# Patient Record
Sex: Female | Born: 1990 | Race: Black or African American | Hispanic: No | Marital: Single | Smoking: Former smoker
Health system: Southern US, Community
[De-identification: ages and names within clinical notes are randomized; demographics above are authoritative.]

## PROBLEM LIST (undated history)

## (undated) DIAGNOSIS — F32A Depression, unspecified: Secondary | ICD-10-CM

## (undated) DIAGNOSIS — F329 Major depressive disorder, single episode, unspecified: Secondary | ICD-10-CM

## (undated) DIAGNOSIS — K219 Gastro-esophageal reflux disease without esophagitis: Secondary | ICD-10-CM

## (undated) DIAGNOSIS — R42 Dizziness and giddiness: Secondary | ICD-10-CM

## (undated) DIAGNOSIS — F419 Anxiety disorder, unspecified: Secondary | ICD-10-CM

## (undated) HISTORY — PX: OTHER SURGICAL HISTORY: SHX169

## (undated) HISTORY — PX: NO PAST SURGERIES: SHX2092

## (undated) HISTORY — PX: MOUTH SURGERY: SHX715

---
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
## 2015-12-12 ENCOUNTER — Inpatient Hospital Stay (HOSPITAL_COMMUNITY)
Admission: AD | Admit: 2015-12-12 | Discharge: 2015-12-12 | Disposition: A | Discharge status: Home / Self Care | Payer: Self-pay | Source: Ambulatory Visit | Attending: Obstetrics and Gynecology | Admitting: Obstetrics and Gynecology

## 2015-12-12 ENCOUNTER — Encounter (HOSPITAL_COMMUNITY): Payer: Self-pay | Admitting: *Deleted

## 2015-12-12 DIAGNOSIS — B9689 Other specified bacterial agents as the cause of diseases classified elsewhere: Secondary | ICD-10-CM

## 2015-12-12 DIAGNOSIS — D62 Acute posthemorrhagic anemia: Secondary | ICD-10-CM

## 2015-12-12 DIAGNOSIS — N938 Other specified abnormal uterine and vaginal bleeding: Secondary | ICD-10-CM | POA: Insufficient documentation

## 2015-12-12 DIAGNOSIS — N760 Acute vaginitis: Secondary | ICD-10-CM | POA: Insufficient documentation

## 2015-12-12 DIAGNOSIS — N939 Abnormal uterine and vaginal bleeding, unspecified: Secondary | ICD-10-CM

## 2015-12-12 HISTORY — DX: Dizziness and giddiness: R42

## 2015-12-12 LAB — WET PREP, GENITAL
Sperm: NONE SEEN
Trich, Wet Prep: NONE SEEN
WBC, Wet Prep HPF POC: NONE SEEN
Yeast Wet Prep HPF POC: NONE SEEN

## 2015-12-12 LAB — URINALYSIS, ROUTINE W REFLEX MICROSCOPIC
Bilirubin Urine: NEGATIVE
GLUCOSE, UA: NEGATIVE mg/dL
Hgb urine dipstick: NEGATIVE
Ketones, ur: NEGATIVE mg/dL
LEUKOCYTES UA: NEGATIVE
NITRITE: NEGATIVE
PROTEIN: 30 mg/dL — AB
Specific Gravity, Urine: 1.015 (ref 1.005–1.030)
pH: 7.5 (ref 5.0–8.0)

## 2015-12-12 LAB — URINE MICROSCOPIC-ADD ON

## 2015-12-12 LAB — CBC
HCT: 30.4 % — ABNORMAL LOW (ref 36.0–46.0)
HEMOGLOBIN: 9.6 g/dL — AB (ref 12.0–15.0)
MCH: 24.9 pg — AB (ref 26.0–34.0)
MCHC: 31.6 g/dL (ref 30.0–36.0)
MCV: 78.8 fL (ref 78.0–100.0)
Platelets: 406 10*3/uL — ABNORMAL HIGH (ref 150–400)
RBC: 3.86 MIL/uL — AB (ref 3.87–5.11)
RDW: 13.3 % (ref 11.5–15.5)
WBC: 4.3 10*3/uL (ref 4.0–10.5)

## 2015-12-12 LAB — POCT PREGNANCY, URINE: Preg Test, Ur: NEGATIVE

## 2015-12-12 MED ORDER — METRONIDAZOLE 500 MG PO TABS: 500.0000 mg | ORAL_TABLET | Freq: Two times a day (BID) | ORAL | Status: AC

## 2015-12-12 MED ORDER — NORGESTIMATE-ETH ESTRADIOL 0.25-35 MG-MCG PO TABS: 1.0000 | ORAL_TABLET | Freq: Every day | ORAL | Status: AC

## 2015-12-12 MED ORDER — FERROUS SULFATE 325 (65 FE) MG PO TABS: 325.0000 mg | ORAL_TABLET | Freq: Two times a day (BID) | ORAL | Status: AC

## 2015-12-12 NOTE — Discharge Instructions (Signed)
Abnormal Uterine Bleeding °Abnormal uterine bleeding can affect women at various stages in life, including teenagers, women in their reproductive years, pregnant women, and women who have reached menopause. Several kinds of uterine bleeding are considered abnormal, including: °· Bleeding or spotting between periods.   °· Bleeding after sexual intercourse.   °· Bleeding that is heavier or more than normal.   °· Periods that last longer than usual. °· Bleeding after menopause.   °Many cases of abnormal uterine bleeding are minor and simple to treat, while others are more serious. Any type of abnormal bleeding should be evaluated by your health care provider. Treatment will depend on the cause of the bleeding. °HOME CARE INSTRUCTIONS °Monitor your condition for any changes. The following actions may help to alleviate any discomfort you are experiencing: °· Avoid the use of tampons and douches as directed by your health care provider. °· Change your pads frequently. °You should get regular pelvic exams and Pap tests. Keep all follow-up appointments for diagnostic tests as directed by your health care provider.  °SEEK MEDICAL CARE IF:  °· Your bleeding lasts more than 1 week.   °· You feel dizzy at times.   °SEEK IMMEDIATE MEDICAL CARE IF:  °· You pass out.   °· You are changing pads every 15 to 30 minutes.   °· You have abdominal pain. °· You have a fever.   °· You become sweaty or weak.   °· You are passing large blood clots from the vagina.   °· You start to feel nauseous and vomit. °MAKE SURE YOU:  °· Understand these instructions. °· Will watch your condition. °· Will get help right away if you are not doing well or get worse. °  °This information is not intended to replace advice given to you by your health care provider. Make sure you discuss any questions you have with your health care provider. °  °Document Released: 06/18/2005 Document Revised: 06/23/2013 Document Reviewed: 01/15/2013 °Elsevier Interactive  Patient Education ©2016 [employer] °Bacterial Vaginosis °Bacterial vaginosis is a vaginal infection that occurs when the normal balance of bacteria in the vagina is disrupted. It results from an overgrowth of certain bacteria. This is the most common vaginal infection in women of childbearing age. Treatment is important to prevent complications, especially in pregnant women, as it can cause a premature delivery. °CAUSES  °Bacterial vaginosis is caused by an increase in harmful bacteria that are normally present in smaller amounts in the vagina. Several different kinds of bacteria can cause bacterial vaginosis. However, the reason that the condition develops is not fully understood. °RISK FACTORS °Certain activities or behaviors can put you at an increased risk of developing bacterial vaginosis, including: °· Having a new sex partner or multiple sex partners. °· Douching. °· Using an intrauterine device (IUD) for contraception. °Women do not get bacterial vaginosis from toilet seats, bedding, swimming pools, or contact with objects around them. °SIGNS AND SYMPTOMS  °Some women with bacterial vaginosis have no signs or symptoms. Common symptoms include: °· Grey vaginal discharge. °· A fishlike odor with discharge, especially after sexual intercourse. °· Itching or burning of the vagina and vulva. °· Burning or pain with urination. °DIAGNOSIS  °Your health care provider will take a medical history and examine the vagina for signs of bacterial vaginosis. A sample of vaginal fluid may be taken. Your health care provider will look at this sample under a microscope to check for bacteria and abnormal cells. A vaginal pH test may also be done.  °TREATMENT  °Bacterial vaginosis may be treated   with antibiotic medicines. These may be given in the form of a pill or a vaginal cream. A second round of antibiotics may be prescribed if the condition comes back after treatment. Because bacterial vaginosis increases your risk for  sexually transmitted diseases, getting treated can help reduce your risk for chlamydia, gonorrhea, HIV, and herpes. °HOME CARE INSTRUCTIONS  °· Only take over-the-counter or prescription medicines as directed by your health care provider. °· If antibiotic medicine was prescribed, take it as directed. Make sure you finish it even if you start to feel better. °· Tell all sexual partners that you have a vaginal infection. They should see their health care provider and be treated if they have problems, such as a mild rash or itching. °· During treatment, it is important that you follow these instructions: °¨ Avoid sexual activity or use condoms correctly. °¨ Do not douche. °¨ Avoid alcohol as directed by your health care provider. °¨ Avoid breastfeeding as directed by your health care provider. °SEEK MEDICAL CARE IF:  °· Your symptoms are not improving after 3 days of treatment. °· You have increased discharge or pain. °· You have a fever. °MAKE SURE YOU:  °· Understand these instructions. °· Will watch your condition. °· Will get help right away if you are not doing well or get worse. °FOR MORE INFORMATION  °Centers for Disease Control and Prevention, Division of STD Prevention: www.cdc.gov/std °American Sexual Health Association (ASHA): www.ashastd.org  °  °This information is not intended to replace advice given to you by your health care provider. Make sure you discuss any questions you have with your health care provider. °  °Document Released: 06/18/2005 Document Revised: 07/09/2014 Document Reviewed: 01/28/2013 °Elsevier Interactive Patient Education ©2016 Elsevier Inc. ° °

## 2015-12-12 NOTE — MAU Note (Addendum)
Has been on cycle for 3 months.  At first thought it was stress related, but it has continued.  Has not had sex since Dec. No real pain or dizziness

## 2015-12-12 NOTE — MAU Provider Note (Signed)
History     CSN: [id number]  Arrival date and time: 12/12/15 1022   First Provider Initiated Contact with Patient 12/12/15 1115      Chief Complaint  Patient presents with  . Vaginal Bleeding   HPI   [name] is a 25 y.o. female G0P0000 with a history of irregular cycles,  here with abnormal vaginal bleeding.  Her last menstrual cycle was 4/11; she has been bleeding everyday since the start of her period. The bleeding waxes and wanes from heavy to light. She denies dizziness.  She does not have GYN here; recently moved.   OB History    Gravida Para Term Preterm AB TAB SAB Ectopic Multiple Living        Past Medical History  Diagnosis Date  . Vertigo     Past Surgical History  Procedure Laterality Date  . No past surgeries      No family history on file.  Social History  Substance Use Topics  . Smoking status: Never Smoker   . Smokeless tobacco: None  . Alcohol Use: Yes    Allergies:  Allergies  Allergen Reactions  . Shellfish Allergy Anaphylaxis    Prescriptions prior to admission  Medication Sig Dispense Refill Last Dose  . acetaminophen (TYLENOL) 500 MG tablet Take 500 mg by mouth every 6 (six) hours as needed for mild pain.   Past Month at Unknown time   Results for orders placed or performed during the hospital encounter of 12/12/15 (from the past 48 hour(s))  Urinalysis, Routine w reflex microscopic (not at [facility])     Status: Abnormal   Collection Time: 12/12/15 10:36 AM  Result Value Ref Range   Color, Urine YELLOW YELLOW   APPearance CLEAR CLEAR   Specific Gravity, Urine 1.015 1.005 - 1.030   pH 7.5 5.0 - 8.0   Glucose, UA NEGATIVE NEGATIVE mg/dL   Hgb urine dipstick NEGATIVE NEGATIVE   Bilirubin Urine NEGATIVE NEGATIVE   Ketones, ur NEGATIVE NEGATIVE mg/dL   Protein, ur 30 (A) NEGATIVE mg/dL   Nitrite NEGATIVE NEGATIVE   Leukocytes, UA NEGATIVE NEGATIVE  Urine microscopic-add on     Status: Abnormal   Collection  Time: 12/12/15 10:36 AM  Result Value Ref Range   Squamous Epithelial / LPF 0-5 (A) NONE SEEN   WBC, UA 0-5 0 - 5 WBC/hpf   RBC / HPF 0-5 0 - 5 RBC/hpf   Bacteria, UA RARE (A) NONE SEEN   Urine-Other MUCOUS PRESENT   Pregnancy, urine POC     Status: None   Collection Time: 12/12/15 10:45 AM  Result Value Ref Range   Preg Test, Ur NEGATIVE NEGATIVE    Comment:        THE SENSITIVITY OF THIS METHODOLOGY IS >24 mIU/mL   CBC     Status: Abnormal   Collection Time: 12/12/15 11:14 AM  Result Value Ref Range   WBC 4.3 4.0 - 10.5 K/uL   RBC 3.86 (L) 3.87 - 5.11 MIL/uL   Hemoglobin 9.6 (L) 12.0 - 15.0 g/dL   HCT 78.2 (L) 95.6 - 21.3 %   MCV 78.8 78.0 - 100.0 fL   MCH 24.9 (L) 26.0 - 34.0 pg   MCHC 31.6 30.0 - 36.0 g/dL   RDW 08.6 57.8 - 46.9 %   Platelets 406 (H) 150 - 400 K/uL  Wet prep, genital     Status: Abnormal   Collection Time: 12/12/15 11:25 AM  Result Value Ref Range   Yeast Wet Prep HPF POC NONE SEEN NONE SEEN   Trich, Wet Prep NONE SEEN NONE SEEN   Clue Cells Wet Prep HPF POC PRESENT (A) NONE SEEN   WBC, Wet Prep HPF POC NONE SEEN NONE SEEN    Comment: MANY BACTERIA SEEN   Sperm NONE SEEN     Review of Systems  Constitutional: Positive for chills. Negative for fever and malaise/fatigue.  Gastrointestinal: Negative for abdominal pain.  Neurological: Negative for dizziness and weakness.   Physical Exam   Blood pressure 112/74, pulse 98, temperature 98.3 F (36.8 C), temperature source Oral, resp. rate 18, weight 171 lb 12.8 oz (77.928 kg), last menstrual period 10/11/2015.  Physical Exam  Constitutional: She is oriented to person, place, and time. She appears well-developed and well-nourished. No distress.  GI: Soft. She exhibits no distension. There is no tenderness. There is no rebound and no guarding.  Genitourinary:  Speculum exam: Vagina - Small amount of dark red blood in the vagina, no odor Cervix - + active bleeding from the os.  Bimanual exam: Cervix  closed Uterus non tender, normal size Adnexa non tender, no masses bilaterally GC/Chlam, wet prep done Chaperone present for exam.  Musculoskeletal: Normal range of motion.  Neurological: She is alert and oriented to person, place, and time.  Skin: Skin is warm. She is not diaphoretic.  Psychiatric: Her behavior is normal.    MAU Course  Procedures  None  MDM  Denies history of DVT or PE. She is a Non-smoker.   Assessment and Plan   A:  1. Abnormal vaginal bleeding   2. Acute blood loss anemia   3. Bacterial vaginosis     P:  Discharge home in stable condition Rx: Sprintec- double pills for the first 5 days then one pill per day, iron BID, Flagyl  Return to MAU if symptoms worsen  Bleeding precautions Patient given [facility] contact number if she would like to call to schedule follow up visit.    Duane LopeJennifer I Salahuddin Arismendez, NP 12/12/2015 11:19 AM

## 2015-12-13 LAB — GC/CHLAMYDIA PROBE AMP (~~LOC~~) NOT AT ARMC
CHLAMYDIA, DNA PROBE: NEGATIVE
Neisseria Gonorrhea: NEGATIVE

## 2016-06-21 ENCOUNTER — Emergency Department (HOSPITAL_COMMUNITY)
Admission: EM | Admit: 2016-06-21 | Discharge: 2016-06-21 | Disposition: A | Discharge status: Home / Self Care | Payer: Self-pay | Attending: Emergency Medicine | Admitting: Emergency Medicine

## 2016-06-21 ENCOUNTER — Encounter (HOSPITAL_COMMUNITY): Payer: Self-pay | Admitting: Nurse Practitioner

## 2016-06-21 DIAGNOSIS — K029 Dental caries, unspecified: Secondary | ICD-10-CM

## 2016-06-21 DIAGNOSIS — J029 Acute pharyngitis, unspecified: Secondary | ICD-10-CM

## 2016-06-21 DIAGNOSIS — K0889 Other specified disorders of teeth and supporting structures: Secondary | ICD-10-CM

## 2016-06-21 DIAGNOSIS — F17200 Nicotine dependence, unspecified, uncomplicated: Secondary | ICD-10-CM | POA: Insufficient documentation

## 2016-06-21 LAB — RAPID STREP SCREEN (MED CTR MEBANE ONLY): Streptococcus, Group A Screen (Direct): NEGATIVE

## 2016-06-21 MED ORDER — PENICILLIN V POTASSIUM 500 MG PO TABS: 500.0000 mg | ORAL_TABLET | Freq: Four times a day (QID) | ORAL | 0 refills | Status: AC

## 2016-06-21 MED ORDER — NAPROXEN 500 MG PO TABS: 500.0000 mg | ORAL_TABLET | Freq: Two times a day (BID) | ORAL | 0 refills | Status: AC

## 2016-06-21 MED ORDER — ACETAMINOPHEN 500 MG PO TABS
1000.0000 mg | ORAL_TABLET | Freq: Once | ORAL | Status: AC
  Administered 2016-06-21: 1000 mg via ORAL
  Filled 2016-06-21: qty 2

## 2016-06-21 NOTE — ED Triage Notes (Signed)
Pt presents with c/o mouth pain. The pain has been intermittent over the past several weeks. The pain became constant and severe today. She reports facial swelling earlier today that decreased now

## 2016-06-21 NOTE — ED Provider Notes (Signed)
[facility]-EMERGENCY DEPT Provider Note   CSN: [id number] Arrival date & time: 06/21/16  1907  By signing my name below, I, Carolyn Vasquez, attest that this documentation has been prepared under the direction and in the presence of Cheri FowlerKayla Siler Mavis, PA-C Electronically Signed: Soijett Vasquez, ED Scribe. 06/21/16. 7:41 PM.  History   Chief Complaint Chief Complaint  Patient presents with  . Dental Pain    HPI Carolyn Vasquez is a 25 y.o. female who presents to the Emergency Department complaining of intermittent, generalized, dental pain (R>L) onset 2 weeks ago worsening today. Pt notes that she has a past hx of a dental abscess that she has had drained. She is having associated symptoms of sore throat. She has tried 400 mg OTC ibuprofen with no relief for her symptoms. She denies fever, chills, drainage, drooling, neck pain, and any other symptoms. Pt denies allergies to medications.   The history is provided by the patient. No language interpreter was used.    Past Medical History:  Diagnosis Date  . Vertigo     There are no active problems to display for this patient.   Past Surgical History:  Procedure Laterality Date  . NO PAST SURGERIES      OB History    Gravida Para Term Preterm AB Living   0 0 0 0 0 0   SAB TAB Ectopic Multiple Live Births   0 0 0 0         Home Medications    Prior to Admission medications   Medication Sig Start Date End Date Taking? Authorizing Provider  acetaminophen (TYLENOL) 500 MG tablet Take 500 mg by mouth every 6 (six) hours as needed for mild pain.    Historical Provider, MD  ferrous sulfate 325 (65 FE) MG tablet Take 1 tablet (325 mg total) by mouth 2 (two) times daily with a meal. 12/12/15   Harolyn RutherfordJennifer I Rasch, NP  metroNIDAZOLE (FLAGYL) 500 MG tablet Take 1 tablet (500 mg total) by mouth 2 (two) times daily. 12/12/15   Duane LopeJennifer I Rasch, NP  naproxen (NAPROSYN) 500 MG tablet Take 1 tablet (500 mg total) by mouth 2 (two) times daily. 06/21/16    Cheri FowlerKayla Lanae Federer, PA-C  norgestimate-ethinyl estradiol (ORTHO-CYCLEN,SPRINTEC,PREVIFEM) 0.25-35 MG-MCG tablet Take 1 tablet by mouth daily. 12/12/15   Duane LopeJennifer I Rasch, NP  penicillin v potassium (VEETID) 500 MG tablet Take 1 tablet (500 mg total) by mouth 4 (four) times daily. 06/21/16 06/28/16  Cheri FowlerKayla Coden Franchi, PA-C    Family History History reviewed. No pertinent family history.  Social History Social History  Substance Use Topics  . Smoking status: Current Every Day Smoker  . Smokeless tobacco: Not on file  . Alcohol use Yes     Allergies   Shellfish allergy   Review of Systems Review of Systems  Constitutional: Negative for chills and fever.  HENT: Positive for dental problem (generalized) and sore throat. Negative for drooling.        No drainage to the affected area    Physical Exam Updated Vital Signs Pulse 106   Temp 98.3 F (36.8 C) (Oral)   Resp 16   SpO2 100%   Physical Exam  Constitutional: She is oriented to person, place, and time. She appears well-developed and well-nourished.  HENT:  Head: Normocephalic and atraumatic.  Mouth/Throat: Uvula is midline and mucous membranes are normal. No oral lesions. No trismus in the jaw. Abnormal dentition. Dental caries present. No dental abscesses or uvula swelling. No posterior oropharyngeal edema, posterior  oropharyngeal erythema or tonsillar abscesses. Tonsils are 2+ on the right. Tonsils are 2+ on the left. Tonsillar exudate.  No tongue swelling or facial swelling.  Airway patent. Patient tolerating secretions without difficulty.   Eyes: Conjunctivae are normal.  Neck: Normal range of motion. Neck supple.  No evidence of Ludwigs angina.  Cardiovascular: Normal rate, regular rhythm and normal heart sounds.   Pulmonary/Chest: Effort normal and breath sounds normal.  Abdominal: Bowel sounds are normal. She exhibits no distension.  Musculoskeletal:  Moves all extremities spontaneously.  Lymphadenopathy:    She has no  cervical adenopathy.  Neurological: She is alert and oriented to person, place, and time.  Speech clear without dysarthria.   Skin: Skin is warm and dry.  Nursing note and vitals reviewed.    ED Treatments / Results  DIAGNOSTIC STUDIES: Oxygen Saturation is 100% on RA, nl by my interpretation.    COORDINATION OF CARE: 7:39 PM Discussed treatment plan with pt at bedside which includes rapid strep screen and culture, referral and follow up with dentist, penicillin Rx, alternate ibuprofen and tylenol PRN, and pt agreed to plan.  Labs Labs Reviewed  RAPID STREP SCREEN (NOT AT [facility])  CULTURE, GROUP A STREP [facility])    Procedures Procedures (including critical care time)  Medications Ordered in ED Medications  acetaminophen (TYLENOL) tablet 1,000 mg (1,000 mg Oral Given 06/21/16 1944)     Initial Impression / Assessment and Plan / ED Course  I have reviewed the triage vital signs and the nursing notes.  Clinical Course    Patient with dentalgia and viral pharyngitis.  No abscess requiring immediate incision and drainage.  Exam not concerning for Ludwig's angina or pharyngeal abscess. Rapid strep screen collected due to oropharyngeal exudates with negative results. Do not suspect PTA or RPA.  Discussed that results of strep culture are pending and patient will be informed if positive result and abx will be called in at that time. Will discharge with penicillin and naproxen Rx. Pt advised of symptomatic therapy. Pt instructed to follow-up with dentist.  Discussed return precautions. Pt safe for discharge.   Final Clinical Impressions(s) / ED Diagnoses   Final diagnoses:  Dental caries  Pain, dental  Pharyngitis, unspecified etiology    New Prescriptions New Prescriptions   NAPROXEN (NAPROSYN) 500 MG TABLET    Take 1 tablet (500 mg total) by mouth 2 (two) times daily.   PENICILLIN V POTASSIUM (VEETID) 500 MG TABLET    Take 1 tablet (500 mg total) by mouth 4 (four) times daily.     I personally performed the services described in this documentation, which was scribed in my presence. The recorded information has been reviewed and is accurate.     Cheri FowlerKayla Rayfield Beem, PA-C 06/21/16 2028    Rolland PorterMark James, MD 06/30/16 [phone]

## 2016-06-21 NOTE — Discharge Instructions (Signed)
Please follow up with a Dentist for further evaluation and management.   Call [phone] for Emergent Dental Care  Check this website for free, low-income or sliding scale dental services in [place]. www.freedental.us   To find a dentist in the [place] or surrounding areas check this website: MyMultiple.fihttp://www.ncdental.org/for-the-public/find-[email or web address]-dentisPlease follow-up with the dentist using the resources provided.   Your rapid strep was negative.  This is likely viral. Take penicillin 4 times daily to treat for possible dental infection. You may take Naproxen twice daily for pain. Return to the emergency department for sudden worsening pain, fever, tongue swelling, neck pain, or any new or concerning symptoms.

## 2016-06-23 LAB — CULTURE, GROUP A STREP (THRC)

## 2017-11-15 ENCOUNTER — Other Ambulatory Visit: Payer: Self-pay | Admitting: Obstetrics and Gynecology

## 2017-11-15 ENCOUNTER — Other Ambulatory Visit (HOSPITAL_COMMUNITY)
Admission: RE | Admit: 2017-11-15 | Discharge: 2017-11-15 | Disposition: A | Discharge status: Home / Self Care | Payer: BLUE CROSS/BLUE SHIELD | Source: Ambulatory Visit | Attending: Obstetrics and Gynecology | Admitting: Obstetrics and Gynecology

## 2017-11-15 DIAGNOSIS — Z01419 Encounter for gynecological examination (general) (routine) without abnormal findings: Secondary | ICD-10-CM | POA: Diagnosis not present

## 2017-11-20 LAB — CYTOLOGY - PAP: Diagnosis: NEGATIVE

## 2018-11-24 ENCOUNTER — Other Ambulatory Visit: Payer: Self-pay

## 2018-11-24 ENCOUNTER — Emergency Department (HOSPITAL_COMMUNITY): Payer: BLUE CROSS/BLUE SHIELD

## 2018-11-24 ENCOUNTER — Emergency Department (HOSPITAL_COMMUNITY)
Admission: EM | Admit: 2018-11-24 | Discharge: 2018-11-25 | Disposition: A | Discharge status: Home / Self Care | Payer: BLUE CROSS/BLUE SHIELD | Attending: Emergency Medicine | Admitting: Emergency Medicine

## 2018-11-24 ENCOUNTER — Encounter (HOSPITAL_COMMUNITY): Payer: Self-pay

## 2018-11-24 DIAGNOSIS — F17200 Nicotine dependence, unspecified, uncomplicated: Secondary | ICD-10-CM | POA: Insufficient documentation

## 2018-11-24 DIAGNOSIS — F1210 Cannabis abuse, uncomplicated: Secondary | ICD-10-CM | POA: Insufficient documentation

## 2018-11-24 DIAGNOSIS — R0789 Other chest pain: Secondary | ICD-10-CM | POA: Diagnosis present

## 2018-11-24 DIAGNOSIS — Z79899 Other long term (current) drug therapy: Secondary | ICD-10-CM | POA: Insufficient documentation

## 2018-11-24 DIAGNOSIS — E876 Hypokalemia: Secondary | ICD-10-CM | POA: Diagnosis not present

## 2018-11-24 DIAGNOSIS — K219 Gastro-esophageal reflux disease without esophagitis: Secondary | ICD-10-CM | POA: Insufficient documentation

## 2018-11-24 DIAGNOSIS — R202 Paresthesia of skin: Secondary | ICD-10-CM | POA: Diagnosis not present

## 2018-11-24 LAB — CBC WITH DIFFERENTIAL/PLATELET
Abs Immature Granulocytes: 0.01 10*3/uL (ref 0.00–0.07)
Basophils Absolute: 0 10*3/uL (ref 0.0–0.1)
Basophils Relative: 1 %
Eosinophils Absolute: 0.3 10*3/uL (ref 0.0–0.5)
Eosinophils Relative: 5 %
HCT: 38.9 % (ref 36.0–46.0)
Hemoglobin: 12.2 g/dL (ref 12.0–15.0)
Immature Granulocytes: 0 %
Lymphocytes Relative: 41 %
Lymphs Abs: 2.4 10*3/uL (ref 0.7–4.0)
MCH: 27.5 pg (ref 26.0–34.0)
MCHC: 31.4 g/dL (ref 30.0–36.0)
MCV: 87.8 fL (ref 80.0–100.0)
Monocytes Absolute: 0.5 10*3/uL (ref 0.1–1.0)
Monocytes Relative: 8 %
Neutro Abs: 2.7 10*3/uL (ref 1.7–7.7)
Neutrophils Relative %: 45 %
Platelets: 303 10*3/uL (ref 150–400)
RBC: 4.43 MIL/uL (ref 3.87–5.11)
RDW: 13.2 % (ref 11.5–15.5)
WBC: 5.8 10*3/uL (ref 4.0–10.5)
nRBC: 0 % (ref 0.0–0.2)

## 2018-11-24 LAB — I-STAT TROPONIN, ED: Troponin i, poc: 0 ng/mL (ref 0.00–0.08)

## 2018-11-24 MED ORDER — DICYCLOMINE HCL 10 MG/5ML PO SOLN
10.0000 mg | Freq: Once | ORAL | Status: AC
Start: 2018-11-24 — End: 2018-11-24
  Administered 2018-11-24: 10 mg via ORAL
  Filled 2018-11-24: qty 5

## 2018-11-24 MED ORDER — ALUM & MAG HYDROXIDE-SIMETH 200-200-20 MG/5ML PO SUSP
30.0000 mL | Freq: Once | ORAL | Status: AC
  Administered 2018-11-24: 30 mL via ORAL
  Filled 2018-11-24: qty 30

## 2018-11-24 NOTE — ED Notes (Signed)
Pt transported to xray 

## 2018-11-24 NOTE — ED Provider Notes (Signed)
[facility]-EMERGENCY DEPT Provider Note   CSN: [id number] Arrival date & time: 11/24/18  2059    History   Chief Complaint Chief Complaint  Patient presents with  . Tingling    HPI Carolyn Vasquez is a 28 y.o. female.     HPI  This is a 28 year old female who presents with chest discomfort.  Patient reports several week history of ongoing chest discomfort.  She states that it is in her center of her chest and is nonradiating.  It sometimes is worse with eating.  She thought it was reflux and took antacids with no relief.  She has not had any fevers, cough, shortness of breath.  She does report tingling in the left arm and left leg.  She states that she is unsure whether this is related.  She also has noted that she has had difficulty with her left hand and she feels that has been weak.  She denies any specific neck or back pain.  Denies headaches.  Currently she rates her pain at 7 out of 10.  She saw her primary physician who thought she had allergies.  She does not tolerate allergy medication because of somnolence.  Past Medical History:  Diagnosis Date  . Vertigo     There are no active problems to display for this patient.   Past Surgical History:  Procedure Laterality Date  . NO PAST SURGERIES       OB History    Gravida  0   Para  0   Term  0   Preterm  0   AB  0   Living  0     SAB  0   TAB  0   Ectopic  0   Multiple  0   Live Births               Home Medications    Prior to Admission medications   Medication Sig Start Date End Date Taking? Authorizing Provider  acetaminophen (TYLENOL) 500 MG tablet Take 500 mg by mouth every 6 (six) hours as needed for mild pain.   Yes [provider]  famotidine (PEPCID) 20 MG tablet Take 20 mg by mouth 2 (two) times daily as needed for heartburn or indigestion.   Yes [provider]  fluticasone (FLONASE) 50 MCG/ACT nasal spray Place 2 sprays into both nostrils  daily as needed for allergies or rhinitis.   Yes [provider]  levocetirizine (XYZAL) 5 MG tablet Take 5 mg by mouth daily as needed for allergies.   Yes [provider]  Multiple Vitamin (MULTIVITAMIN WITH MINERALS) TABS tablet Take 1 tablet by mouth daily.   Yes [provider]  ferrous sulfate 325 (65 FE) MG tablet Take 1 tablet (325 mg total) by mouth 2 (two) times daily with a meal. Patient not taking: Reported on 11/24/2018 12/12/15   Rasch, Victorino Dike I, NP  metroNIDAZOLE (FLAGYL) 500 MG tablet Take 1 tablet (500 mg total) by mouth 2 (two) times daily. Patient not taking: Reported on 11/24/2018 12/12/15   Rasch, Victorino Dike I, NP  naproxen (NAPROSYN) 500 MG tablet Take 1 tablet (500 mg total) by mouth 2 (two) times daily. Patient not taking: Reported on 11/24/2018 06/21/16   Cheri Fowler, PA-C  norgestimate-ethinyl estradiol (ORTHO-CYCLEN,SPRINTEC,PREVIFEM) 0.25-35 MG-MCG tablet Take 1 tablet by mouth daily. Patient not taking: Reported on 11/24/2018 12/12/15   Rasch, Victorino Dike I, NP  omeprazole (PRILOSEC) 20 MG capsule Take 1 capsule (20 mg total) by  mouth daily. 11/25/18   Unika Nazareno, Mayer Masker, MD  potassium chloride SA (K-DUR) 20 MEQ tablet Take 2 tablets (40 mEq total) by mouth daily. 11/25/18   Ngoc Daughtridge, Mayer Masker, MD    Family History History reviewed. No pertinent family history.  Social History Social History   Tobacco Use  . Smoking status: Current Every Day Smoker  Substance Use Topics  . Alcohol use: Yes  . Drug use: Yes    Types: Marijuana     Allergies   Shellfish allergy   Review of Systems Review of Systems  Constitutional: Negative for fever.  Eyes: Negative for visual disturbance.  Respiratory: Negative for shortness of breath.   Cardiovascular: Positive for chest pain. Negative for leg swelling.  Gastrointestinal: Negative for abdominal pain, nausea and vomiting.  Genitourinary: Negative for dysuria.  Skin: Negative for rash.   Neurological: Positive for weakness.       Tingling left arm and left leg  All other systems reviewed and are negative.    Physical Exam Updated Vital Signs BP 112/85 (BP Location: Left Arm)   Pulse 78   Temp 98.7 F (37.1 C) (Oral)   Resp 15   LMP 11/04/2018   SpO2 98%   Physical Exam Vitals signs and nursing note reviewed.  Constitutional:      Appearance: She is well-developed.  HENT:     Head: Normocephalic and atraumatic.  Eyes:     Pupils: Pupils are equal, round, and reactive to light.  Neck:     Musculoskeletal: Neck supple.  Cardiovascular:     Rate and Rhythm: Normal rate and regular rhythm.     Heart sounds: Normal heart sounds.  Pulmonary:     Effort: Pulmonary effort is normal. No respiratory distress.     Breath sounds: No wheezing.  Abdominal:     General: Bowel sounds are normal.     Palpations: Abdomen is soft.  Musculoskeletal:     Right lower leg: No edema.     Left lower leg: No edema.  Skin:    General: Skin is warm and dry.  Neurological:     Mental Status: She is alert and oriented to person, place, and time.     Comments: Cranial nerves II through XII intact, 5 out of 5 strength in all 4 extremities including grip strength, biceps, triceps, deltoids, knee flexors and extensors, symmetric reflexes bilaterally  Psychiatric:        Mood and Affect: Mood normal.      ED Treatments / Results  Labs (all labs ordered are listed, but only abnormal results are displayed) Labs Reviewed  BASIC METABOLIC PANEL - Abnormal; Notable for the following components:      Result Value   Potassium 3.1 (*)    All other components within normal limits  CBC WITH DIFFERENTIAL/PLATELET  I-STAT TROPONIN, ED    EKG EKG Interpretation  Date/Time:  Monday Nov 24 2018 23:39:32 EDT Ventricular Rate:  74 PR Interval:    QRS Duration: 87 QT Interval:  389 QTC Calculation: 432 R Axis:   45 Text Interpretation:  Sinus rhythm Confirmed by Ross Marcus  [phone]) on 11/25/2018 12:26:06 AM   Radiology Dg Chest 2 View  Result Date: 11/24/2018 CLINICAL DATA:  Chest pain EXAM: CHEST - 2 VIEW COMPARISON:  None. FINDINGS: Lungs are clear. Heart size and pulmonary vascularity are normal. No adenopathy. No pneumothorax. No bone lesions. IMPRESSION: No edema or consolidation. Electronically Signed   By: Bretta Bang III M.D.  On: 11/24/2018 23:41    Procedures Procedures (including critical care time)  Medications Ordered in ED Medications  alum & mag hydroxide-simeth (MAALOX/MYLANTA) 200-200-20 MG/5ML suspension 30 mL (30 mLs Oral Given 11/24/18 2344)  dicyclomine (BENTYL) 10 MG/5ML syrup 10 mg (10 mg Oral Given 11/24/18 2344)  potassium chloride SA (K-DUR) CR tablet 40 mEq (40 mEq Oral Given 11/25/18 0044)     Initial Impression / Assessment and Plan / ED Course  I have reviewed the triage vital signs and the nursing notes.  Pertinent labs & imaging results that were available during my care of the patient were reviewed by me and considered in my medical decision making (see chart for details).        Patient presents with several complaints.  Initially with chest discomfort ongoing for the last several weeks as well as tingling and weakness in the left arm and leg.  She is overall nontoxic-appearing and vital signs are reassuring.  Regarding her chest pain, she is low risk for PE.  EKG shows no evidence of arrhythmia or ischemia.  Chest x-ray shows no evidence of pneumothorax or pneumonia.  There is some relation to food.  This would be suggestive of reflux.  Patient did have improvement of pain with GI cocktail.  Lab work shows slightly low potassium.  While this can contribute to paresthesias, slightly concerned that her paresthesias are one-sided.  She also reports weakness.  On my physical exam, I cannot elicit any asymmetric weakness or neurologic findings.  Given her age and waxing and waning symptoms, question possible MS.  She does  not have any vision changes.  I discussed this with the patient.  On recheck, she is currently symptom-free.  I discussed MRI head neck versus watchful waiting and monitoring closely of symptoms.  Patient does not wish to stay for an MRI.  She understands the importance of receiving an MRI especially if she develops progressive or worsening symptoms including vision changes.  After history, exam, and medical workup I feel the patient has been appropriately medically screened and is safe for discharge home. Pertinent diagnoses were discussed with the patient. Patient was given return precautions.   Final Clinical Impressions(s) / ED Diagnoses   Final diagnoses:  Tingling  Gastroesophageal reflux disease without esophagitis  Hypokalemia    ED Discharge Orders         Ordered    potassium chloride SA (K-DUR) 20 MEQ tablet  Daily     11/25/18 0048    omeprazole (PRILOSEC) 20 MG capsule  Daily     11/25/18 0048           Shon Baton, MD 11/25/18 [id number]

## 2018-11-24 NOTE — ED Triage Notes (Signed)
Pt states that her L arm, hand and leg are tingling. She went to her PCP with the same complaint and was told it was allergies. She also endorses intermittent burning in her chest. A&Ox4. Ambulatory.

## 2018-11-25 LAB — BASIC METABOLIC PANEL
Anion gap: 6 (ref 5–15)
BUN: 9 mg/dL (ref 6–20)
CO2: 26 mmol/L (ref 22–32)
Calcium: 8.9 mg/dL (ref 8.9–10.3)
Chloride: 105 mmol/L (ref 98–111)
Creatinine, Ser: 0.68 mg/dL (ref 0.44–1.00)
GFR calc Af Amer: >60 mL/min (ref >60)
GFR calc non Af Amer: >60 mL/min (ref >60)
Glucose, Bld: 85 mg/dL (ref 70–99)
Potassium: 3.1 mmol/L — ABNORMAL LOW (ref 3.5–5.1)
Sodium: 137 mmol/L (ref 135–145)

## 2018-11-25 MED ORDER — POTASSIUM CHLORIDE CRYS ER 20 MEQ PO TBCR: 40.0000 meq | EXTENDED_RELEASE_TABLET | Freq: Every day | ORAL | 0 refills | Status: AC

## 2018-11-25 MED ORDER — POTASSIUM CHLORIDE CRYS ER 20 MEQ PO TBCR
40.0000 meq | EXTENDED_RELEASE_TABLET | Freq: Once | ORAL | Status: AC
  Administered 2018-11-25: 40 meq via ORAL
  Filled 2018-11-25: qty 2

## 2018-11-25 MED ORDER — OMEPRAZOLE 20 MG PO CPDR: 20.0000 mg | DELAYED_RELEASE_CAPSULE | Freq: Every day | ORAL | 0 refills | Status: AC

## 2018-11-25 NOTE — Discharge Instructions (Addendum)
You were seen today for several complaints.  Your chest discomfort is likely related to reflux.  Take medications as prescribed.  Your potassium was slightly low.  Take potassium for the next 5 days and increase potassium in your diet.  Regarding your tingling and weakness, monitor your symptoms closely.  If it returns and persists, gets worse, you develop vision changes, you should be reevaluated immediately and will need an MRI.

## 2018-12-01 ENCOUNTER — Emergency Department (HOSPITAL_COMMUNITY)
Admission: EM | Admit: 2018-12-01 | Discharge: 2018-12-02 | Disposition: A | Discharge status: Home / Self Care | Payer: BLUE CROSS/BLUE SHIELD | Attending: Emergency Medicine | Admitting: Emergency Medicine

## 2018-12-01 ENCOUNTER — Emergency Department (HOSPITAL_COMMUNITY): Payer: BLUE CROSS/BLUE SHIELD

## 2018-12-01 ENCOUNTER — Encounter (HOSPITAL_COMMUNITY): Payer: Self-pay | Admitting: Emergency Medicine

## 2018-12-01 ENCOUNTER — Other Ambulatory Visit: Payer: Self-pay

## 2018-12-01 DIAGNOSIS — F17210 Nicotine dependence, cigarettes, uncomplicated: Secondary | ICD-10-CM | POA: Insufficient documentation

## 2018-12-01 DIAGNOSIS — F1290 Cannabis use, unspecified, uncomplicated: Secondary | ICD-10-CM | POA: Diagnosis not present

## 2018-12-01 DIAGNOSIS — F419 Anxiety disorder, unspecified: Secondary | ICD-10-CM | POA: Diagnosis not present

## 2018-12-01 DIAGNOSIS — Z79899 Other long term (current) drug therapy: Secondary | ICD-10-CM | POA: Diagnosis not present

## 2018-12-01 DIAGNOSIS — R0789 Other chest pain: Secondary | ICD-10-CM | POA: Diagnosis not present

## 2018-12-01 LAB — CBC
HCT: 38.4 % (ref 36.0–46.0)
Hemoglobin: 12.2 g/dL (ref 12.0–15.0)
MCH: 27.1 pg (ref 26.0–34.0)
MCHC: 31.8 g/dL (ref 30.0–36.0)
MCV: 85.3 fL (ref 80.0–100.0)
Platelets: 309 10*3/uL (ref 150–400)
RBC: 4.5 MIL/uL (ref 3.87–5.11)
RDW: 13.2 % (ref 11.5–15.5)
WBC: 4.5 10*3/uL (ref 4.0–10.5)
nRBC: 0 % (ref 0.0–0.2)

## 2018-12-01 LAB — BASIC METABOLIC PANEL
Anion gap: 9 (ref 5–15)
BUN: 9 mg/dL (ref 6–20)
CO2: 23 mmol/L (ref 22–32)
Calcium: 9.3 mg/dL (ref 8.9–10.3)
Chloride: 103 mmol/L (ref 98–111)
Creatinine, Ser: 0.72 mg/dL (ref 0.44–1.00)
GFR calc Af Amer: >60 mL/min (ref >60)
GFR calc non Af Amer: >60 mL/min (ref >60)
Glucose, Bld: 82 mg/dL (ref 70–99)
Potassium: 4 mmol/L (ref 3.5–5.1)
Sodium: 135 mmol/L (ref 135–145)

## 2018-12-01 LAB — TROPONIN I: Troponin I: <0.03 ng/mL (ref <0.03)

## 2018-12-01 LAB — I-STAT BETA HCG BLOOD, ED (MC, WL, AP ONLY): I-stat hCG, quantitative: <5 m[IU]/mL (ref <5)

## 2018-12-01 MED ORDER — SODIUM CHLORIDE 0.9% FLUSH: 3.0000 mL | Freq: Once | INTRAVENOUS | Status: DC

## 2018-12-01 MED ORDER — LORAZEPAM 1 MG PO TABS
0.5000 mg | ORAL_TABLET | Freq: Once | ORAL | Status: AC
  Administered 2018-12-01: 0.5 mg via ORAL
  Filled 2018-12-01: qty 1

## 2018-12-01 NOTE — ED Triage Notes (Signed)
Pt c/o left sided chest pain that radiates to her back, describes pain as burning. Pt states she was seen at [facility] for the same and given potassium supplements, with no change in symptoms.

## 2018-12-01 NOTE — ED Provider Notes (Signed)
[facility]   CSN: [id number] Arrival date & time: 12/01/18  1711    History   Chief Complaint Chief Complaint  Patient presents with  . Chest Pain    HPI Carolyn Vasquez is a 28 y.o. female with a hx of vertigo presents to the Emergency Department complaining of waxing and waning chest pain over the last 2-3 days.  Pt reports she was evaluated for this and left sided tingling on 11/24/2018. She reports resolution of the tingling without return, but CP did return despite use of her prilosec.  Pt reports the pain feels like a burning sensation and is located throughout her entire chest and back, but does not radiate to the jaw or arms.  She reports no worsening of pain with deep breathing, exertion or position.  Pt denies recent illness, fever or sick contacts.  She denies hx of travel, DVT, lupus, birth control usage.  She denies family hx of early/sudden cardiac death.  Pt reports the pain is associated with palpitations and comes when she is feeling anxious.  She reports taking a walk, deep breathing and other calming exercises help her anxiety and chest pain.  She reports a recent diagnosis of depression, but was reluctant to start medications.       The history is provided by the patient and medical records. No language interpreter was used.    Past Medical History:  Diagnosis Date  . Vertigo     There are no active problems to display for this patient.   Past Surgical History:  Procedure Laterality Date  . NO PAST SURGERIES       OB History    Gravida  0   Para  0   Term  0   Preterm  0   AB  0   Living  0     SAB  0   TAB  0   Ectopic  0   Multiple  0   Live Births               Home Medications    Prior to Admission medications   Medication Sig Start Date End Date Taking? Authorizing Provider  fluticasone (FLONASE) 50 MCG/ACT nasal spray Place 2 sprays into both nostrils daily as needed for  allergies or rhinitis.   Yes [provider]  Multiple Vitamin (MULTIVITAMIN WITH MINERALS) TABS tablet Take 1 tablet by mouth daily.   Yes [provider]  omeprazole (PRILOSEC) 20 MG capsule Take 1 capsule (20 mg total) by mouth daily. 11/25/18  Yes Horton, Mayer Maskerourtney F, MD  ferrous sulfate 325 (65 FE) MG tablet Take 1 tablet (325 mg total) by mouth 2 (two) times daily with a meal. Patient not taking: Reported on 11/24/2018 12/12/15   Rasch, Victorino DikeJennifer I, NP  metroNIDAZOLE (FLAGYL) 500 MG tablet Take 1 tablet (500 mg total) by mouth 2 (two) times daily. Patient not taking: Reported on 11/24/2018 12/12/15   Rasch, Victorino DikeJennifer I, NP  naproxen (NAPROSYN) 500 MG tablet Take 1 tablet (500 mg total) by mouth 2 (two) times daily. Patient not taking: Reported on 11/24/2018 06/21/16   Cheri Fowlerose, Kayla, PA-C  norgestimate-ethinyl estradiol (ORTHO-CYCLEN,SPRINTEC,PREVIFEM) 0.25-35 MG-MCG tablet Take 1 tablet by mouth daily. Patient not taking: Reported on 11/24/2018 12/12/15   Rasch, Victorino DikeJennifer I, NP  potassium chloride SA (K-DUR) 20 MEQ tablet Take 2 tablets (40 mEq total) by mouth daily. Patient not taking: Reported on 12/02/2018 11/25/18   Horton, Mayer MaskerCourtney F,  MD    Family History No family history on file.  Social History Social History   Tobacco Use  . Smoking status: Current Every Day Smoker  . Smokeless tobacco: Never Used  Substance Use Topics  . Alcohol use: Yes  . Drug use: Yes    Types: Marijuana     Allergies   Shellfish allergy   Review of Systems Review of Systems  Constitutional: Negative for appetite change, diaphoresis, fatigue, fever and unexpected weight change.  HENT: Negative for mouth sores.   Eyes: Negative for visual disturbance.  Respiratory: Negative for cough, chest tightness, shortness of breath and wheezing.   Cardiovascular: Positive for chest pain.  Gastrointestinal: Negative for abdominal pain, constipation, diarrhea, nausea and vomiting.  Endocrine: Negative  for polydipsia, polyphagia and polyuria.  Genitourinary: Negative for dysuria, frequency, hematuria and urgency.  Musculoskeletal: Negative for back pain and neck stiffness.  Skin: Negative for rash.  Allergic/Immunologic: Negative for immunocompromised state.  Neurological: Negative for syncope, light-headedness and headaches.  Hematological: Does not bruise/bleed easily.  Psychiatric/Behavioral: Negative for sleep disturbance. The patient is nervous/anxious.      Physical Exam Updated Vital Signs BP 110/86 (BP Location: Right Arm)   Pulse 84   Temp 98.4 F (36.9 C) (Oral)   Resp 16   Ht 5' (1.524 m)   Wt 76.2 kg   LMP 11/04/2018   SpO2 100%   BMI 32.81 kg/m   Physical Exam Vitals signs and nursing note reviewed.  Constitutional:      General: She is not in acute distress.    Appearance: She is well-developed. She is not diaphoretic.     Comments: Awake, alert, nontoxic appearance  HENT:     Head: Normocephalic and atraumatic.     Mouth/Throat:     Pharynx: No oropharyngeal exudate.  Eyes:     General: No scleral icterus.    Conjunctiva/sclera: Conjunctivae normal.  Neck:     Musculoskeletal: Normal range of motion and neck supple.  Cardiovascular:     Rate and Rhythm: Normal rate and regular rhythm.  Pulmonary:     Effort: Pulmonary effort is normal. No respiratory distress.     Breath sounds: Normal breath sounds. No wheezing.  Abdominal:     General: Bowel sounds are normal.     Palpations: Abdomen is soft. There is no mass.     Tenderness: There is no abdominal tenderness. There is no guarding or rebound.  Musculoskeletal: Normal range of motion.  Skin:    General: Skin is warm and dry.  Neurological:     Mental Status: She is alert.     Comments: Speech is clear and goal oriented Moves extremities without ataxia  Psychiatric:        Mood and Affect: Mood is anxious.     Comments: Pt very anxious, becomes tachycardic when talking about symptoms and  situation, but tachycardia resolves with reassurance.      ED Treatments / Results  Labs (all labs ordered are listed, but only abnormal results are displayed) Labs Reviewed  BASIC METABOLIC PANEL  CBC  TROPONIN I  HEPATIC FUNCTION PANEL  LIPASE, BLOOD  D-DIMER, QUANTITATIVE (NOT AT [facility])  I-STAT BETA HCG BLOOD, ED ([facility], [facility], [facility] ONLY)    EKG EKG Interpretation  Date/Time:  Monday December 01 2018 17:24:28 EDT Ventricular Rate:  81 PR Interval:  172 QRS Duration: 76 QT Interval:  358 QTC Calculation: 415 R Axis:   55 Text Interpretation:  Normal sinus rhythm Artifact ,  mild Otherwise within normal limits Confirmed by Gerhard Munch [phone]) on 12/01/2018 11:15:38 PM   Radiology Dg Chest 2 View  Result Date: 12/01/2018 CLINICAL DATA:  Burning sensation across chest. EXAM: CHEST - 2 VIEW COMPARISON:  Nov 24, 2018 FINDINGS: The heart size and mediastinal contours are within normal limits. Both lungs are clear. The visualized skeletal structures are unremarkable. IMPRESSION: No active cardiopulmonary disease. Electronically Signed   By: Gerome Sam III M.D   On: 12/01/2018 18:02    Procedures Procedures (including critical care time)  Medications Ordered in ED Medications  sodium chloride flush (NS) 0.9 % injection 3 mL (has no administration in time range)  LORazepam (ATIVAN) tablet 0.5 mg (0.5 mg Oral Given 12/01/18 2325)     Initial Impression / Assessment and Plan / ED Course  I have reviewed the triage vital signs and the nursing notes.  Pertinent labs & imaging results that were available during my care of the patient were reviewed by me and considered in my medical decision making (see chart for details).  Clinical Course as of Dec 02 154  Tue Dec 02, 2018  0151 Neg - pt low risk for PR/DVT  D-Dimer, Quant: <0.27 [HM]  0152 Improved from last visit  Potassium: 4.0 [HM]  0152 No tachycardia  Pulse Rate: 87 [HM]  0152 No hypoxia  SpO2: 100 % [HM]  0152 neg   I-stat hCG, quantitative: <5.0 [HM]  0152 WNL - no evidence of pancreatitis  Lipase: 23 [HM]  0152 Normal AST/ALT/Total bili - doubt gallbladder pathology  AST: 21 [HM]  0153 No leukocytosis  WBC: 4.5 [HM]  0153 No anemia  Hemoglobin: 12.2 [HM]    Clinical Course User Index [HM] Kathryne Ramella, Dahlia Client, PA-C       Carolyn Vasquez was evaluated in Emergency Department on 12/02/2018 for the symptoms described in the history of present illness. She was evaluated in the context of the global COVID-19 pandemic, which necessitated consideration that the patient might be at risk for infection with the SARS-CoV-2 virus that causes COVID-19. Institutional protocols and algorithms that pertain to the evaluation of patients at risk for COVID-19 are in a state of rapid change based on information released by regulatory bodies including the CDC and federal and state organizations. These policies and algorithms were followed during the patient's care in the ED.  Pt presents with chest pain, waxing and waning.  On hx it appears this is most consistent with anxiety.  Records were reviewed and pt was evaluated for similar symptoms on 11/24/2018 with reassuring w/u.  Pt was treated for presumed gastritis.  Suspect this was the case and GERD was likely exacerbated by anxiety symptoms.  Labwork pending including d-dimer.  Pt is low risk PE and is without tachycardia or hypoxia here in the ED.  Doubt COVID at this time.  No leukocytosis, anemia or electrolyte abnormality.  Hypokalemia from last visit has resolved.    1:53 AM Pt is well appearing.  Her CP is completely resolved.  Pt reports feeling less anxious.  Discussed normal w/u with patient.  Pt is low risk for ACS/PE and I doubt other cardiac or pulmonary etiology.  Though she is clearly anxious and this seems to exacerbate her chest pain, pt was informed that she still needs to follow-up with her PCP to rule out other causes of chest pain and she should continue  her PPI.  Pt given counseling resources and stress reduction techniques discussed.  Also discussed reasons to return  to the emergency department.  Pt states understanding and is in agreement with the plan.     Final Clinical Impressions(s) / ED Diagnoses   Final diagnoses:  Anterior chest wall pain  Anxiety    ED Discharge Orders    None       Mardene Sayer Boyd Kerbs 12/02/18 0156    Gerhard Munch, MD 12/03/18 1642

## 2018-12-01 NOTE — ED Notes (Signed)
ED Provider at bedside. 

## 2018-12-02 LAB — LIPASE, BLOOD: Lipase: 23 U/L (ref 11–51)

## 2018-12-02 LAB — HEPATIC FUNCTION PANEL
ALT: 18 U/L (ref 0–44)
AST: 21 U/L (ref 15–41)
Albumin: 3.8 g/dL (ref 3.5–5.0)
Alkaline Phosphatase: 49 U/L (ref 38–126)
Bilirubin, Direct: <0.1 mg/dL (ref 0.0–0.2)
Total Bilirubin: 0.5 mg/dL (ref 0.3–1.2)
Total Protein: 7.2 g/dL (ref 6.5–8.1)

## 2018-12-02 LAB — D-DIMER, QUANTITATIVE: D-Dimer, Quant: <0.27 ug/mL-FEU (ref 0.00–0.50)

## 2018-12-02 NOTE — ED Notes (Signed)
Patient ambulatory to bathroom without difficulty.

## 2018-12-02 NOTE — Discharge Instructions (Addendum)
1. Medications: usual home medications 2. Treatment: rest, drink plenty of fluids, eat well, exercise daily, consider meditation, keep a journal 3. Follow Up: Please followup with your primary doctor and counselor in 2 days for discussion of your diagnoses and further evaluation after today's visit; if you do not have a primary care doctor use the resource guide provided to find one; Please return to the ER for worsening chest pain or pain that is worse with breathing or exertion. Also return for any other concerning symptoms.

## 2018-12-03 ENCOUNTER — Other Ambulatory Visit: Payer: Self-pay | Admitting: Family Medicine

## 2018-12-03 DIAGNOSIS — R531 Weakness: Secondary | ICD-10-CM

## 2018-12-23 ENCOUNTER — Other Ambulatory Visit: Payer: BLUE CROSS/BLUE SHIELD

## 2019-01-16 ENCOUNTER — Other Ambulatory Visit: Payer: Self-pay

## 2019-01-16 ENCOUNTER — Ambulatory Visit
Admission: RE | Admit: 2019-01-16 | Discharge: 2019-01-16 | Disposition: A | Discharge status: Home / Self Care | Payer: BC Managed Care – PPO | Source: Ambulatory Visit | Attending: Family Medicine | Admitting: Family Medicine

## 2019-01-16 DIAGNOSIS — R531 Weakness: Secondary | ICD-10-CM

## 2019-01-25 ENCOUNTER — Emergency Department (HOSPITAL_COMMUNITY)
Admission: EM | Admit: 2019-01-25 | Discharge: 2019-01-25 | Disposition: A | Discharge status: Home / Self Care | Payer: BC Managed Care – PPO | Attending: Emergency Medicine | Admitting: Emergency Medicine

## 2019-01-25 ENCOUNTER — Encounter (HOSPITAL_COMMUNITY): Payer: Self-pay

## 2019-01-25 ENCOUNTER — Emergency Department (HOSPITAL_COMMUNITY): Payer: BC Managed Care – PPO

## 2019-01-25 ENCOUNTER — Other Ambulatory Visit: Payer: Self-pay

## 2019-01-25 DIAGNOSIS — Z91013 Allergy to seafood: Secondary | ICD-10-CM | POA: Insufficient documentation

## 2019-01-25 DIAGNOSIS — K219 Gastro-esophageal reflux disease without esophagitis: Secondary | ICD-10-CM | POA: Insufficient documentation

## 2019-01-25 DIAGNOSIS — K2100 Gastro-esophageal reflux disease with esophagitis, without bleeding: Secondary | ICD-10-CM

## 2019-01-25 DIAGNOSIS — Z79899 Other long term (current) drug therapy: Secondary | ICD-10-CM | POA: Insufficient documentation

## 2019-01-25 DIAGNOSIS — R0789 Other chest pain: Secondary | ICD-10-CM | POA: Diagnosis present

## 2019-01-25 DIAGNOSIS — Z87891 Personal history of nicotine dependence: Secondary | ICD-10-CM | POA: Insufficient documentation

## 2019-01-25 HISTORY — DX: Anxiety disorder, unspecified: F41.9

## 2019-01-25 HISTORY — DX: Depression, unspecified: F32.A

## 2019-01-25 HISTORY — DX: Gastro-esophageal reflux disease without esophagitis: K21.9

## 2019-01-25 MED ORDER — FAMOTIDINE 20 MG PO TABS: 20.0000 mg | ORAL_TABLET | Freq: Two times a day (BID) | ORAL | 0 refills | Status: AC

## 2019-01-25 NOTE — ED Triage Notes (Signed)
Patient states she has been having a runny nose and went to her physician and was told she had post nasal drip. patient states she was prescribed Omeprazole a month ago for c/o burning, burping, and reflux. Patient states she has had no problems until 3 days ago. Patient states she has burning in her chest and upper chest. Patient also c/o reflux when she lays down.

## 2019-01-29 NOTE — ED Provider Notes (Signed)
[facility] DEPT Provider Note   CSN: [id number] Arrival date & time: 01/25/19  1727     History   Chief Complaint Chief Complaint  Patient presents with  . Chest Pain    HPI Carolyn Vasquez is a 28 y.o. female.     HPI   28 year old female with chest pain.  She reports a few day history of a runny nose/congestion.  She went to see her physician and was diagnosed with postnasal drip.  Since around that time she is also been having burning pain in the center of her chest.  Also some pain into her upper mid back.  Has noticed it is been worse at night.  No clear postprandial changes.  She was prescribed omeprazole about a month ago for symptoms of frequent belching and some burning but not quite to the degree of her current symptoms.  She is not currently taking it.  No unusual leg pain or swelling.  No fevers or chills.  No cough.  Past Medical History:  Diagnosis Date  . Anxiety   . Depression   . GERD (gastroesophageal reflux disease)   . Vertigo     There are no active problems to display for this patient.   Past Surgical History:  Procedure Laterality Date  . MOUTH SURGERY    . NO PAST SURGERIES    . tubes in ear       OB History    Gravida  0   Para  0   Term  0   Preterm  0   AB  0   Living  0     SAB  0   TAB  0   Ectopic  0   Multiple  0   Live Births               Home Medications    Prior to Admission medications   Medication Sig Start Date End Date Taking? Authorizing Provider  fexofenadine (ALLEGRA) 180 MG tablet Take 180 mg by mouth daily.   Yes [provider]  fluticasone (FLONASE) 50 MCG/ACT nasal spray Place 2 sprays into both nostrils daily as needed for allergies or rhinitis.   Yes [provider]  Multiple Vitamin (MULTIVITAMIN WITH MINERALS) TABS tablet Take 1 tablet by mouth daily.   Yes [provider]  PE-Doxylamine-DM-GG-APAP (TYLENOL COLD & FLU DAY/NIGHT PO)  Take 2 capsules by mouth 2 (two) times a day.   Yes [provider]  TURMERIC PO Take 1 tablet by mouth 2 (two) times a day.   Yes [provider]  famotidine (PEPCID) 20 MG tablet Take 1 tablet (20 mg total) by mouth 2 (two) times daily. 01/25/19   Virgel Manifold, MD  ferrous sulfate 325 (65 FE) MG tablet Take 1 tablet (325 mg total) by mouth 2 (two) times daily with a meal. Patient not taking: Reported on 11/24/2018 12/12/15   Rasch, Anderson Malta I, NP  metroNIDAZOLE (FLAGYL) 500 MG tablet Take 1 tablet (500 mg total) by mouth 2 (two) times daily. Patient not taking: Reported on 11/24/2018 12/12/15   Rasch, Anderson Malta I, NP  naproxen (NAPROSYN) 500 MG tablet Take 1 tablet (500 mg total) by mouth 2 (two) times daily. Patient not taking: Reported on 11/24/2018 06/21/16   Gloriann Loan, PA-C  norgestimate-ethinyl estradiol (ORTHO-CYCLEN,SPRINTEC,PREVIFEM) 0.25-35 MG-MCG tablet Take 1 tablet by mouth daily. Patient not taking: Reported on 11/24/2018 12/12/15   Rasch, Anderson Malta I, NP  omeprazole (PRILOSEC) 20 MG capsule  Take 1 capsule (20 mg total) by mouth daily. Patient not taking: Reported on 01/25/2019 11/25/18   Horton, Mayer Maskerourtney F, MD  potassium chloride SA (K-DUR) 20 MEQ tablet Take 2 tablets (40 mEq total) by mouth daily. Patient not taking: Reported on 12/02/2018 11/25/18   Horton, Mayer Maskerourtney F, MD    Family History Family History  Problem Relation Age of Onset  . Rheum arthritis Mother   . Heart murmur Mother     Social History Social History   Tobacco Use  . Smoking status: Former Smoker    Types: Cigarettes  . Smokeless tobacco: Never Used  Substance Use Topics  . Alcohol use: Yes  . Drug use: Not Currently    Types: Marijuana     Allergies   Shellfish allergy   Review of Systems Review of Systems All systems reviewed and negative, other than as noted in HPI.   Physical Exam Updated Vital Signs BP 113/86 (BP Location: Right Arm)   Pulse 80   Temp 98.8 F (37.1 C)  (Oral)   Resp 18   Ht 5' (1.524 m)   Wt 74.8 kg   LMP 01/11/2019   SpO2 100%   BMI 32.22 kg/m   Physical Exam Vitals signs and nursing note reviewed.  Constitutional:      General: She is not in acute distress.    Appearance: She is well-developed.  HENT:     Head: Normocephalic and atraumatic.  Eyes:     General:        Right eye: No discharge.        Left eye: No discharge.     Conjunctiva/sclera: Conjunctivae normal.  Neck:     Musculoskeletal: Neck supple.  Cardiovascular:     Rate and Rhythm: Normal rate and regular rhythm.     Heart sounds: Normal heart sounds. No murmur. No friction rub. No gallop.   Pulmonary:     Effort: Pulmonary effort is normal. No respiratory distress.     Breath sounds: Normal breath sounds.  Abdominal:     General: There is no distension.     Palpations: Abdomen is soft.     Tenderness: There is no abdominal tenderness.  Musculoskeletal:        General: No tenderness.  Skin:    General: Skin is warm and dry.  Neurological:     Mental Status: She is alert.  Psychiatric:        Behavior: Behavior normal.        Thought Content: Thought content normal.      ED Treatments / Results  Labs (all labs ordered are listed, but only abnormal results are displayed) Labs Reviewed - No data to display  EKG None  Radiology No results found.  Procedures Procedures (including critical care time)  Medications Ordered in ED Medications - No data to display   Initial Impression / Assessment and Plan / ED Course  I have reviewed the triage vital signs and the nursing notes.  Pertinent labs & imaging results that were available during my care of the patient were reviewed by me and considered in my medical decision making (see chart for details).        28 year old female with symptoms that seem very consistent with GERD.  I doubt ACS, PE, dissection or other emergent etiology.  She was advised that she could continue take Prilosec or  try Pepcid.  PCP or GI follow-up for persistent symptoms.  Return precautions discussed.  Final Clinical Impressions(s) /  ED Diagnoses   Final diagnoses:  Gastroesophageal reflux disease with esophagitis    ED Discharge Orders         Ordered    famotidine (PEPCID) 20 MG tablet  2 times daily     01/25/19 2230           Raeford RazorKohut, Shameer Molstad, MD 01/29/19 [phone]

## 2019-02-02 ENCOUNTER — Other Ambulatory Visit: Payer: Self-pay | Admitting: Family Medicine

## 2019-02-02 DIAGNOSIS — K829 Disease of gallbladder, unspecified: Secondary | ICD-10-CM

## 2019-02-09 ENCOUNTER — Other Ambulatory Visit: Payer: BC Managed Care – PPO

## 2019-02-11 ENCOUNTER — Other Ambulatory Visit: Payer: BC Managed Care – PPO

## 2019-02-25 ENCOUNTER — Ambulatory Visit
Admission: RE | Admit: 2019-02-25 | Discharge: 2019-02-25 | Disposition: A | Discharge status: Home / Self Care | Payer: BC Managed Care – PPO | Source: Ambulatory Visit | Attending: Family Medicine | Admitting: Family Medicine

## 2019-02-25 DIAGNOSIS — K829 Disease of gallbladder, unspecified: Secondary | ICD-10-CM

## 2019-12-29 IMAGING — US ULTRASOUND ABDOMEN LIMITED
1 series · 14 of 25 positions shown · non-contrast
Comparison: None.

CLINICAL DATA: Right upper quadrant pain

EXAM:
ULTRASOUND ABDOMEN LIMITED RIGHT UPPER QUADRANT

[Series 1: ultrasound abdomen limited · 0.17mm/px · 14 of 49 slices shown]
[im 1/49]
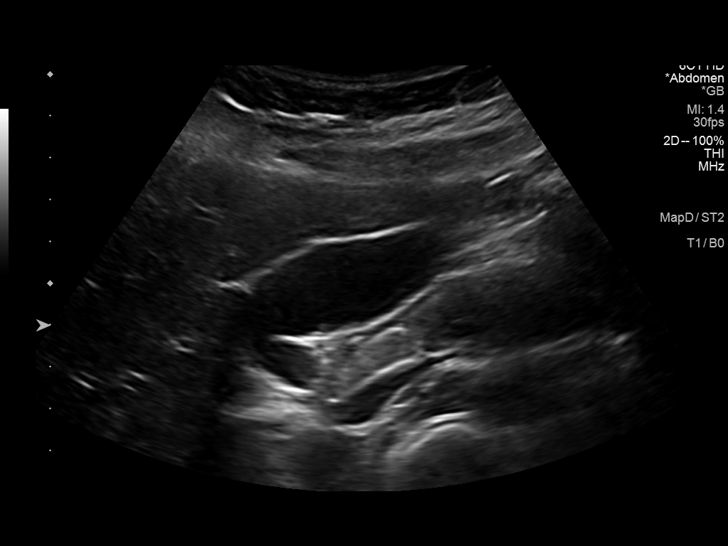
[im 5/49]
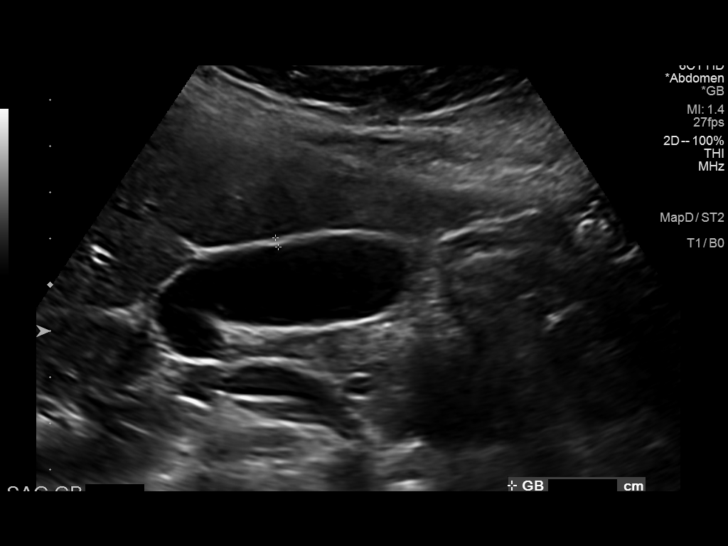
[im 9/49]
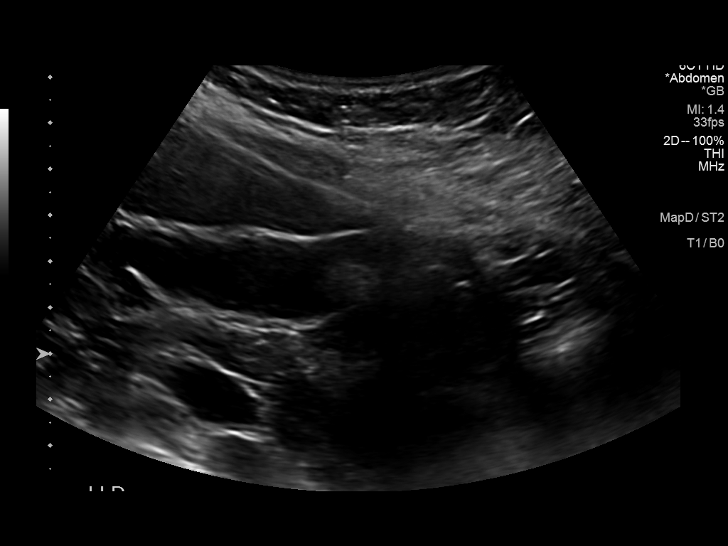
[im 13/49]
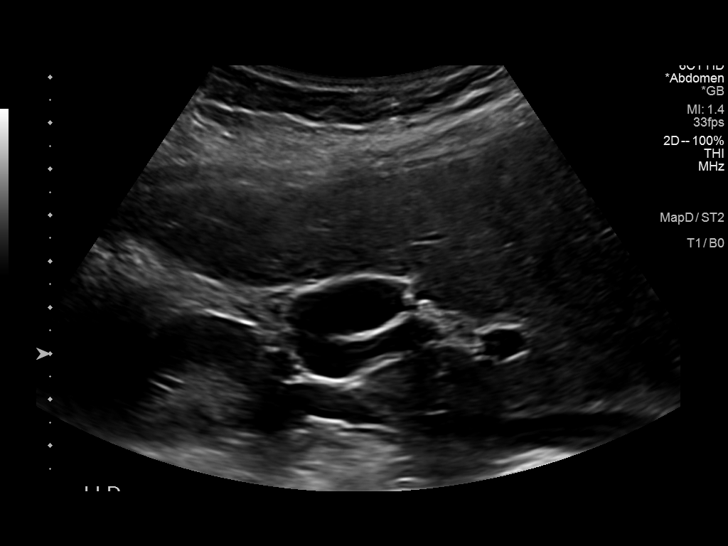
[im 17/49]
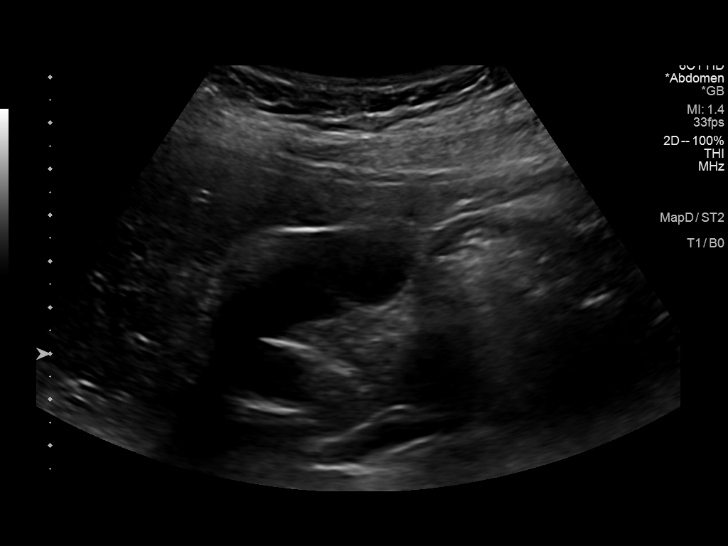
[im 19/49]
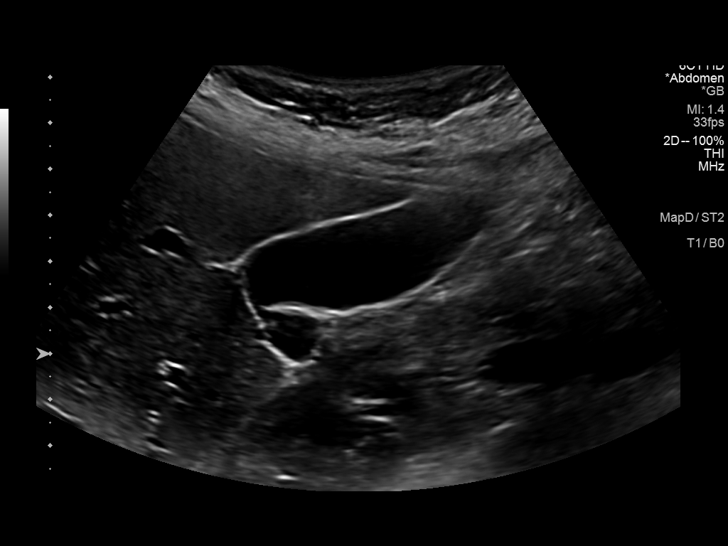
[im 23/49]
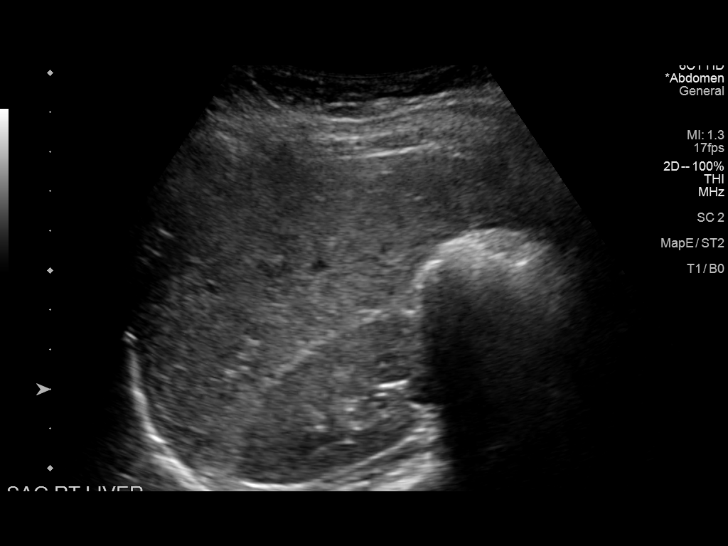
[im 27/49]
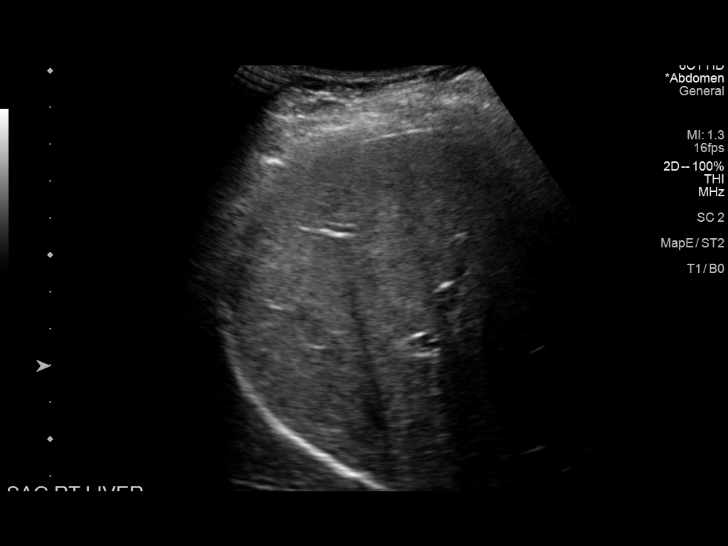
[im 31/49]
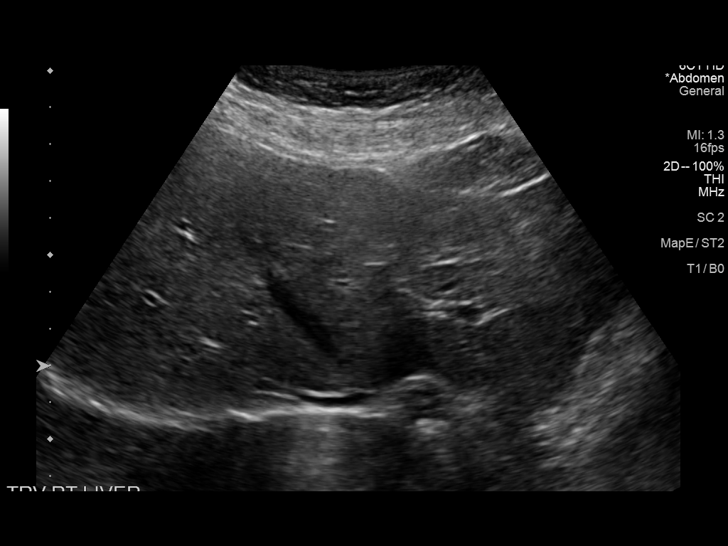
[im 33/49]
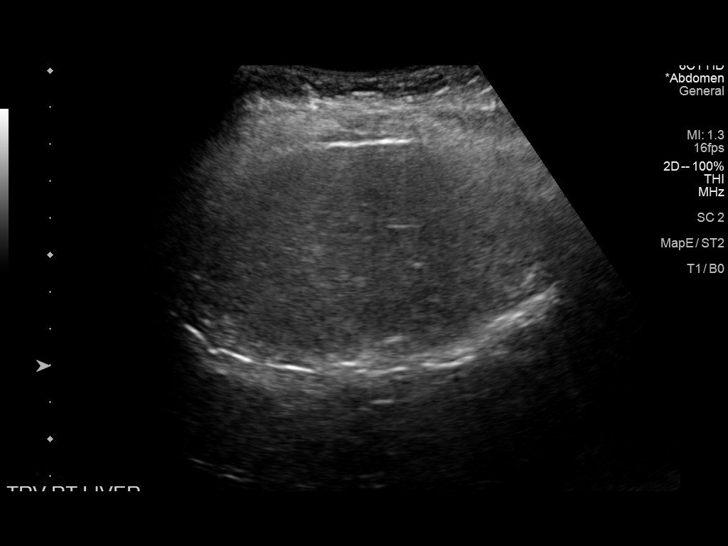
[im 37/49]
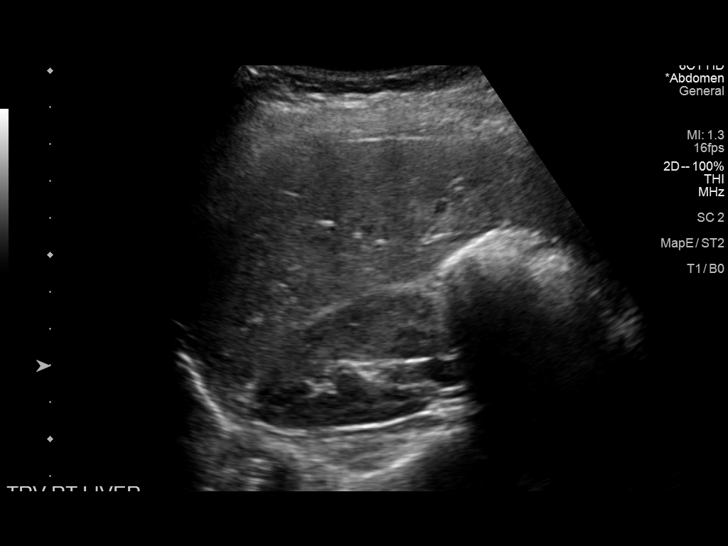
[im 41/49]
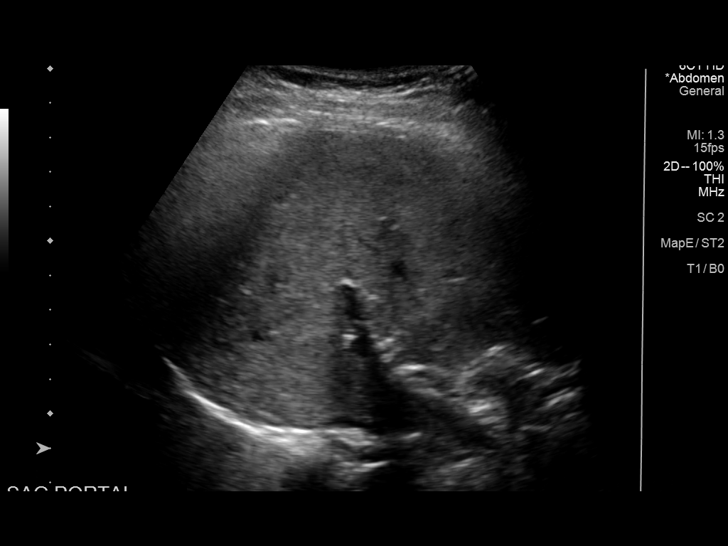
[im 45/49]
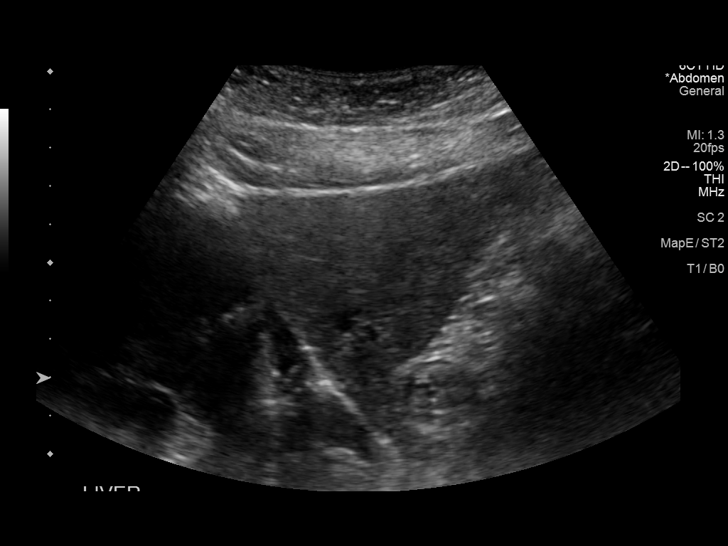
[im 49/49]
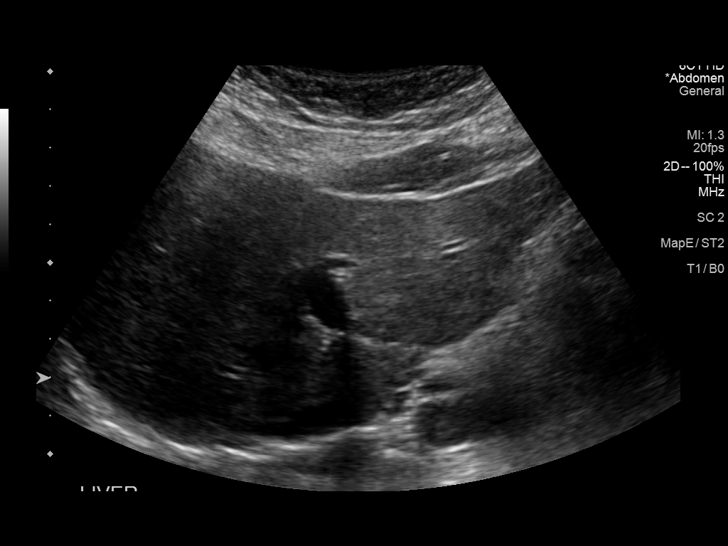

[14 of 25 positions shown; findings below may reference images not displayed]

FINDINGS: Gallbladder:

No gallstones or wall thickening visualized. There is no
pericholecystic fluid. No sonographic Murphy sign noted by
sonographer.

Common bile duct:

Diameter: 3 mm. No intrahepatic or extrahepatic biliary duct
dilatation.

Liver:

No focal lesion identified. Within normal limits in parenchymal
echogenicity. Portal vein is patent on color Doppler imaging with
normal direction of blood flow towards the liver.

Other: None.
IMPRESSION: Study within normal limits.

## 2020-05-14 ENCOUNTER — Other Ambulatory Visit: Payer: Self-pay

## 2020-05-14 DIAGNOSIS — Z20822 Contact with and (suspected) exposure to covid-19: Secondary | ICD-10-CM

## 2020-05-15 LAB — SARS-COV-2, NAA 2 DAY TAT

## 2020-05-15 LAB — NOVEL CORONAVIRUS, NAA: SARS-CoV-2, NAA: NOT DETECTED

## 2020-06-27 ENCOUNTER — Other Ambulatory Visit: Payer: Self-pay

## 2020-06-27 ENCOUNTER — Emergency Department (HOSPITAL_COMMUNITY)
Admission: EM | Admit: 2020-06-27 | Discharge: 2020-06-27 | Disposition: A | Discharge status: Home / Self Care | Payer: No Typology Code available for payment source | Attending: Emergency Medicine | Admitting: Emergency Medicine

## 2020-06-27 DIAGNOSIS — Z5321 Procedure and treatment not carried out due to patient leaving prior to being seen by health care provider: Secondary | ICD-10-CM | POA: Insufficient documentation

## 2020-06-27 DIAGNOSIS — Z77098 Contact with and (suspected) exposure to other hazardous, chiefly nonmedicinal, chemicals: Secondary | ICD-10-CM | POA: Insufficient documentation

## 2020-06-27 NOTE — ED Triage Notes (Signed)
Patient reports to the ER for chemical exposure. 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 Patient reports she did the 15 minute rinse but poison control told her to come to the ER to get the pH and blood work checked. Patient denies vision changes.

## 2020-06-28 NOTE — ED Notes (Signed)
Called 3x. Eloped from waiting area.  °

## 2021-07-29 ENCOUNTER — Other Ambulatory Visit: Payer: Self-pay

## 2021-07-29 ENCOUNTER — Encounter (HOSPITAL_COMMUNITY): Payer: Self-pay | Admitting: Emergency Medicine

## 2021-07-29 ENCOUNTER — Emergency Department (HOSPITAL_COMMUNITY)
Admission: EM | Admit: 2021-07-29 | Discharge: 2021-07-29 | Disposition: A | Discharge status: Home / Self Care | Payer: Managed Care, Other (non HMO) | Attending: Emergency Medicine | Admitting: Emergency Medicine

## 2021-07-29 DIAGNOSIS — R102 Pelvic and perineal pain: Secondary | ICD-10-CM | POA: Insufficient documentation

## 2021-07-29 DIAGNOSIS — Z79899 Other long term (current) drug therapy: Secondary | ICD-10-CM | POA: Diagnosis not present

## 2021-07-29 LAB — CBC WITH DIFFERENTIAL/PLATELET
Abs Immature Granulocytes: 0.03 10*3/uL (ref 0.00–0.07)
Basophils Absolute: 0 10*3/uL (ref 0.0–0.1)
Basophils Relative: 1 %
Eosinophils Absolute: 0.1 10*3/uL (ref 0.0–0.5)
Eosinophils Relative: 4 %
HCT: 39.6 % (ref 36.0–46.0)
Hemoglobin: 12.6 g/dL (ref 12.0–15.0)
Immature Granulocytes: 1 %
Lymphocytes Relative: 36 %
Lymphs Abs: 1.4 10*3/uL (ref 0.7–4.0)
MCH: 27.3 pg (ref 26.0–34.0)
MCHC: 31.8 g/dL (ref 30.0–36.0)
MCV: 85.7 fL (ref 80.0–100.0)
Monocytes Absolute: 0.4 10*3/uL (ref 0.1–1.0)
Monocytes Relative: 10 %
Neutro Abs: 2 10*3/uL (ref 1.7–7.7)
Neutrophils Relative %: 48 %
Platelets: 301 10*3/uL (ref 150–400)
RBC: 4.62 MIL/uL (ref 3.87–5.11)
RDW: 13.5 % (ref 11.5–15.5)
WBC: 4 10*3/uL (ref 4.0–10.5)
nRBC: 0 % (ref 0.0–0.2)

## 2021-07-29 LAB — COMPREHENSIVE METABOLIC PANEL
ALT: 18 U/L (ref 0–44)
AST: 19 U/L (ref 15–41)
Albumin: 3.8 g/dL (ref 3.5–5.0)
Alkaline Phosphatase: 64 U/L (ref 38–126)
Anion gap: 5 (ref 5–15)
BUN: 12 mg/dL (ref 6–20)
CO2: 27 mmol/L (ref 22–32)
Calcium: 8.9 mg/dL (ref 8.9–10.3)
Chloride: 105 mmol/L (ref 98–111)
Creatinine, Ser: 0.89 mg/dL (ref 0.44–1.00)
GFR, Estimated: >60 mL/min (ref >60)
Glucose, Bld: 85 mg/dL (ref 70–99)
Potassium: 4 mmol/L (ref 3.5–5.1)
Sodium: 137 mmol/L (ref 135–145)
Total Bilirubin: 0.3 mg/dL (ref 0.3–1.2)
Total Protein: 7.2 g/dL (ref 6.5–8.1)

## 2021-07-29 LAB — URINALYSIS, ROUTINE W REFLEX MICROSCOPIC
Bilirubin Urine: NEGATIVE
Glucose, UA: NEGATIVE mg/dL
Hgb urine dipstick: NEGATIVE
Ketones, ur: NEGATIVE mg/dL
Leukocytes,Ua: NEGATIVE
Nitrite: NEGATIVE
Protein, ur: NEGATIVE mg/dL
Specific Gravity, Urine: 1.025 (ref 1.005–1.030)
pH: 7 (ref 5.0–8.0)

## 2021-07-29 LAB — LIPASE, BLOOD: Lipase: 26 U/L (ref 11–51)

## 2021-07-29 LAB — I-STAT BETA HCG BLOOD, ED (MC, WL, AP ONLY): I-stat hCG, quantitative: <5 m[IU]/mL (ref <5)

## 2021-07-29 NOTE — ED Provider Notes (Signed)
[facility] DEPT Provider Note   CSN: [id number] Arrival date & time: 07/29/21  1738     History  Chief Complaint  Patient presents with   Pelvic Pain    Carolyn Vasquez is a 31 y.o. female who presents to the ED for evaluation of left-sided pelvic pain that started yesterday.  Patient was watching a nieces basketball game when she first noticed the symptoms.  She describes it as a crampy/pressure.  She notes a small amount of blood when she urinated yesterday, but has not noticed any today.  Pain continues to worsen and is most aggravated with before she has to urinate.  She denies dysuria and urgency.  No treatment prior to arrival.  No alleviating factors.  Patient does have a new sexual partner but denies vaginal discharge or abnormal vaginal bleeding.  She denies fevers, chills, abdominal pain, nausea, vomiting.   Pelvic Pain Pertinent negatives include no abdominal pain, no headaches and no shortness of breath.      Home Medications Prior to Admission medications   Medication Sig Start Date End Date Taking? Authorizing Provider  famotidine (PEPCID) 20 MG tablet Take 1 tablet (20 mg total) by mouth 2 (two) times daily. 01/25/19   Virgel Manifold, MD  ferrous sulfate 325 (65 FE) MG tablet Take 1 tablet (325 mg total) by mouth 2 (two) times daily with a meal. Patient not taking: Reported on 11/24/2018 12/12/15   Rasch, Anderson Malta I, NP  fexofenadine (ALLEGRA) 180 MG tablet Take 180 mg by mouth daily.    [provider]  fluticasone (FLONASE) 50 MCG/ACT nasal spray Place 2 sprays into both nostrils daily as needed for allergies or rhinitis.    [provider]  metroNIDAZOLE (FLAGYL) 500 MG tablet Take 1 tablet (500 mg total) by mouth 2 (two) times daily. Patient not taking: Reported on 11/24/2018 12/12/15   Rasch, Anderson Malta I, NP  Multiple Vitamin (MULTIVITAMIN WITH MINERALS) TABS tablet Take 1 tablet by mouth daily.    [provider]  naproxen (NAPROSYN) 500 MG tablet Take 1 tablet (500 mg total) by mouth 2 (two) times daily. Patient not taking: Reported on 11/24/2018 06/21/16   Gloriann Loan, PA-C  norgestimate-ethinyl estradiol (ORTHO-CYCLEN,SPRINTEC,PREVIFEM) 0.25-35 MG-MCG tablet Take 1 tablet by mouth daily. Patient not taking: Reported on 11/24/2018 12/12/15   Rasch, Anderson Malta I, NP  omeprazole (PRILOSEC) 20 MG capsule Take 1 capsule (20 mg total) by mouth daily. Patient not taking: Reported on 01/25/2019 11/25/18   Horton, Barbette Hair, MD  PE-Doxylamine-DM-GG-APAP (TYLENOL COLD & FLU DAY/NIGHT PO) Take 2 capsules by mouth 2 (two) times a day.    [provider]  potassium chloride SA (K-DUR) 20 MEQ tablet Take 2 tablets (40 mEq total) by mouth daily. Patient not taking: Reported on 12/02/2018 11/25/18   Horton, Barbette Hair, MD  TURMERIC PO Take 1 tablet by mouth 2 (two) times a day.    [provider]      Allergies    Shellfish allergy    Review of Systems   Review of Systems  Constitutional:  Negative for fever.  HENT: Negative.    Eyes: Negative.   Respiratory:  Negative for shortness of breath.   Cardiovascular: Negative.   Gastrointestinal:  Negative for abdominal pain and vomiting.  Endocrine: Negative.   Genitourinary:  Positive for hematuria and pelvic pain. Negative for urgency, vaginal discharge and vaginal pain.  Musculoskeletal: Negative.   Skin:  Negative for rash.  Neurological:  Negative for  headaches.  All other systems reviewed and are negative.  Physical Exam Updated Vital Signs BP 104/89 (BP Location: Right Arm)    Pulse 100    Temp 99.3 F (37.4 C) (Oral)    Resp 18    Ht 5' (1.524 m)    Wt 74.8 kg    LMP 07/11/2020    SpO2 100%    BMI 32.22 kg/m  Physical Exam Vitals and nursing note reviewed. Exam conducted with a chaperone present.  Constitutional:      General: She is not in acute distress.    Appearance: She is not ill-appearing.  HENT:     Head: Atraumatic.   Eyes:     Conjunctiva/sclera: Conjunctivae normal.  Cardiovascular:     Rate and Rhythm: Normal rate and regular rhythm.     Pulses: Normal pulses.     Heart sounds: No murmur heard. Pulmonary:     Effort: Pulmonary effort is normal. No respiratory distress.     Breath sounds: Normal breath sounds.  Abdominal:     General: Abdomen is flat. There is no distension.     Palpations: Abdomen is soft.     Tenderness: There is no abdominal tenderness.  Genitourinary:    General: Normal vulva.     Vagina: Normal.     Cervix: No cervical motion tenderness or erythema.  Musculoskeletal:        General: Normal range of motion.     Cervical back: Normal range of motion.  Skin:    General: Skin is warm and dry.     Capillary Refill: Capillary refill takes less than 2 seconds.  Neurological:     General: No focal deficit present.     Mental Status: She is alert.  Psychiatric:        Mood and Affect: Mood normal.    ED Results / Procedures / Treatments   Labs (all labs ordered are listed, but only abnormal results are displayed) Labs Reviewed  URINALYSIS, ROUTINE W REFLEX MICROSCOPIC - Abnormal; Notable for the following components:      Result Value   APPearance HAZY (*)    All other components within normal limits  WET PREP, GENITAL  COMPREHENSIVE METABOLIC PANEL  CBC WITH DIFFERENTIAL/PLATELET  LIPASE, BLOOD  I-STAT BETA HCG BLOOD, ED ([facility], [facility], [facility] ONLY)  GC/CHLAMYDIA PROBE AMP ([facility]) NOT AT [facility]    EKG None  Radiology No results found.  Procedures Procedures    Medications Ordered in ED Medications - No data to display  ED Course/ Medical Decision Making/ A&P                           Medical Decision Making Amount and/or Complexity of Data Reviewed Labs: ordered.   History:  Carolyn Vasquez is a 31 y.o. female who presents to the ED for evaluation of left-sided pelvic pain that started yesterday.  Patient was watching a nieces basketball game when she  first noticed the symptoms.  She describes it as a crampy/pressure.  She notes a small amount of blood when she urinated yesterday, but has not noticed any today.  Pain continues to worsen and is most aggravated with before she has to urinate.  She denies dysuria and urgency.  No treatment prior to arrival.  No alleviating factors.  Patient does have a new sexual partner but denies vaginal discharge or abnormal vaginal bleeding.  She denies fevers, chills, abdominal pain, nausea, vomiting. This patient  presents to the ED for concern of pelvic pain, this involves an extensive number of treatment options, and is a complaint that carries with it a high risk of complications and morbidity.   Differential diagnosis of her lower abdominal considerations include pelvic inflammatory disease, ectopic pregnancy, appendicitis, urinary calculi, primary dysmenorrhea, septic abortion, ruptured ovarian cyst or tumor, ovarian torsion, tubo-ovarian abscess, degeneration of fibroid, endometriosis, diverticulitis, cystitis.   Initial impression:  Patient sitting up in bed in no acute distress, nontoxic-appearing.  Physical exam was overall benign although she does have left-sided suprapubic tenderness.  Negative CVA tenderness bilaterally.  Labs ordered in triage that were available at time of evaluation include: CMP normal CBC normal Lipase normal Negative pregnancy test UA pending I will do pelvic exam and wet prep, GC chlamydia.  If this is abnormal we will get pelvic ultrasound.  Lab Tests and EKG:  I Ordered, reviewed, and interpreted labs and EKG.  The pertinent results in my decision-making regarding them are detailed in the ED course and/or initial impression section above.   Imaging Studies ordered:  No imaging indicated at this time  Disposition:  After consideration of the diagnostic results, physical exam, history, feel that the patent would benefit from discharge with strict return cautions and  outpatient follow-up.   Pelvic pain: Physical exam reassuring.  Not consistent with ovarian torsion.  Patient has very mild pain without nausea, vomiting.  GC and wet prep results will return in the next day or 2.  Patient requests to leave. Given the rest of her lab work and her physical exam was normal, I think this is a reasonable option.  Pharmacy to call if wet prep ordered GC return with abnormal results.  Patient has a history of ovarian cysts and I am suspicious that this is what is causing her pain right now.  She does have an OB/GYN and she is to follow-up with them if her pain does not improve.  Return precautions were discussed.  All questions asked and answered.  Discharged home in good condition.    Final Clinical Impression(s) / ED Diagnoses Final diagnoses:  Pelvic pain in female    Rx / DC Orders ED Discharge Orders     None         Rodena Piety 07/30/21 1728    Lajean Saver, MD 07/30/21 1954

## 2021-07-29 NOTE — ED Triage Notes (Addendum)
Patient reports left side pelvic pain, started yesterday, worsening today. Pain rated 7/10. Denies injury . Says pain is crampy/pressure. Reports small amounts of pink blood when using bathroom yesterday.

## 2021-07-29 NOTE — ED Provider Triage Note (Signed)
Emergency Medicine Provider Triage Evaluation Note  Carolyn Vasquez , a 31 y.o. female  was evaluated in triage.  Pt complains of   left sided pelvic pain.  She reports it started yesterday.  She says its started yesterday and has been worsening. Last cycle is 07/11/21 and was normal for her.   She had unprotected sexual contact with a female partner about 2.5 weeks ago.  She denies abnormal discharge. She didn't take plan b after.   When she urinated yesterday she had a pelvic pressure, denies dysuria.   Last bm was today, was normal for her.   Her pain is intermittent, feels like its intermittent and coming and going.    Physical Exam  BP 104/89 (BP Location: Right Arm)    Pulse 100    Temp 99.3 F (37.4 C) (Oral)    Resp 18    Ht 5' (1.524 m)    Wt 74.8 kg    LMP 07/11/2020    SpO2 100%    BMI 32.22 kg/m  Gen:   Awake, no distress   Resp:  Normal effort  MSK:   Moves extremities without difficulty  Other:  Abdomen is soft, non tender, non distended.   Medical Decision Making  Medically screening exam initiated at 5:52 PM.  Appropriate orders placed.  Aarin Klunder was informed that the remainder of the evaluation will be completed by another provider, this initial triage assessment does not replace that evaluation, and the importance of remaining in the ED until their evaluation is complete.  Note: Portions of this report may have been transcribed using voice recognition software. Every effort was made to ensure accuracy; however, inadvertent computerized transcription errors may be present    Cristina Gong, PA-C 07/29/21 1800

## 2021-07-29 NOTE — Discharge Instructions (Addendum)
Your laboratory was very reassuring.  Your pregnancy test was negative, and urinalysis without evidence of infection.  Your lab work was also reassuring.  Given your physical exam and presentation, I am less concerned for something like an ovarian torsion.  However if you develop sudden worsening of pain, or sudden vaginal bleeding, please return to the ED.  I will call you with results of your wet prep if the results are abnormal, otherwise no news is good news.  The results of your gonorrhea and Chlamydia should return in the next day or 2 and if those results are abnormal you should receive a call from our pharmacist.  If all of these are normal, it is likely that you have something called an ovarian cyst.  Given that you have a history of cyts, it is very likely.  You can follow-up with your OB/GYN if your pain does not improve over the next week or so.  Please use the Tylenol or ibuprofen as needed for your pain.  Others also find relief using heating pads as well. Feel better soon!

## 2021-07-31 LAB — GC/CHLAMYDIA PROBE AMP (~~LOC~~) NOT AT ARMC
Chlamydia: NEGATIVE
Comment: NEGATIVE
Comment: NORMAL
Neisseria Gonorrhea: NEGATIVE
